# Patient Record
Sex: Female | Born: 1945 | Race: White | Hispanic: No | Marital: Married | Smoking: Never smoker
Health system: Southern US, Community
[De-identification: ages and names within clinical notes are randomized; demographics above are authoritative.]

## PROBLEM LIST (undated history)

## (undated) DIAGNOSIS — K219 Gastro-esophageal reflux disease without esophagitis: Secondary | ICD-10-CM

## (undated) DIAGNOSIS — C55 Malignant neoplasm of uterus, part unspecified: Secondary | ICD-10-CM

## (undated) DIAGNOSIS — M1990 Unspecified osteoarthritis, unspecified site: Secondary | ICD-10-CM

## (undated) DIAGNOSIS — T8859XA Other complications of anesthesia, initial encounter: Secondary | ICD-10-CM

## (undated) DIAGNOSIS — J40 Bronchitis, not specified as acute or chronic: Secondary | ICD-10-CM

## (undated) DIAGNOSIS — R062 Wheezing: Secondary | ICD-10-CM

## (undated) DIAGNOSIS — M8580 Other specified disorders of bone density and structure, unspecified site: Secondary | ICD-10-CM

## (undated) DIAGNOSIS — T4145XA Adverse effect of unspecified anesthetic, initial encounter: Secondary | ICD-10-CM

## (undated) DIAGNOSIS — R55 Syncope and collapse: Secondary | ICD-10-CM

## (undated) DIAGNOSIS — J302 Other seasonal allergic rhinitis: Secondary | ICD-10-CM

## (undated) DIAGNOSIS — H9190 Unspecified hearing loss, unspecified ear: Secondary | ICD-10-CM

## (undated) DIAGNOSIS — E039 Hypothyroidism, unspecified: Secondary | ICD-10-CM

## (undated) DIAGNOSIS — R112 Nausea with vomiting, unspecified: Secondary | ICD-10-CM

## (undated) DIAGNOSIS — Z9889 Other specified postprocedural states: Secondary | ICD-10-CM

## (undated) HISTORY — PX: TUBAL LIGATION: SHX77

## (undated) HISTORY — PX: ABDOMINAL HYSTERECTOMY: SHX81

---

## 2004-04-18 ENCOUNTER — Ambulatory Visit: Payer: Self-pay | Admitting: Unknown Physician Specialty

## 2004-12-05 ENCOUNTER — Emergency Department (HOSPITAL_COMMUNITY): Admission: EM | Admit: 2004-12-05 | Discharge: 2004-12-05 | Payer: Self-pay | Admitting: Emergency Medicine

## 2005-04-24 ENCOUNTER — Ambulatory Visit: Payer: Self-pay | Admitting: Unknown Physician Specialty

## 2005-10-15 ENCOUNTER — Emergency Department: Payer: Self-pay | Admitting: Emergency Medicine

## 2005-10-15 ENCOUNTER — Other Ambulatory Visit: Payer: Self-pay

## 2005-12-29 ENCOUNTER — Ambulatory Visit: Payer: Self-pay | Admitting: Gastroenterology

## 2006-04-27 ENCOUNTER — Ambulatory Visit: Payer: Self-pay | Admitting: Unknown Physician Specialty

## 2007-05-02 ENCOUNTER — Ambulatory Visit: Payer: Self-pay | Admitting: Unknown Physician Specialty

## 2007-07-25 ENCOUNTER — Ambulatory Visit: Payer: Self-pay | Admitting: Otolaryngology

## 2007-07-27 ENCOUNTER — Ambulatory Visit: Payer: Self-pay | Admitting: Otolaryngology

## 2007-07-27 IMAGING — CT CT CHEST-ABD W/ CM
1 of 2 series · 14 of 32 positions shown, 19 images · non-contrast
Comparison: none

REASON FOR EXAM: SOB Abn Chest Xray Eval for Subdithragmatic
COMMENTS:

[Series 2: soft tissue · axial · 0.74mm/px · z∈[+690,+1096]mm · 14 of 93 slices shown, 19 images]
[im 6/93  mediastinal]
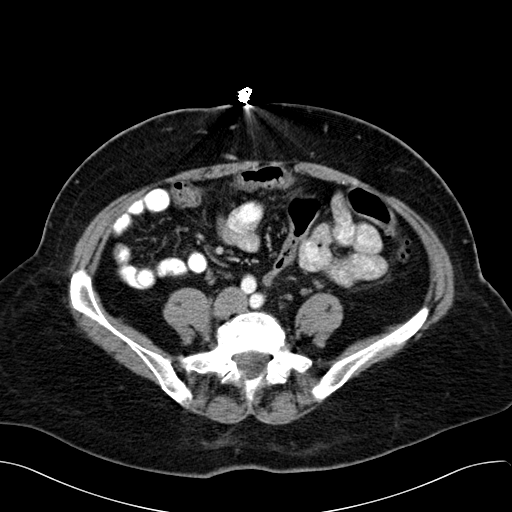
[im 6/93  bone]
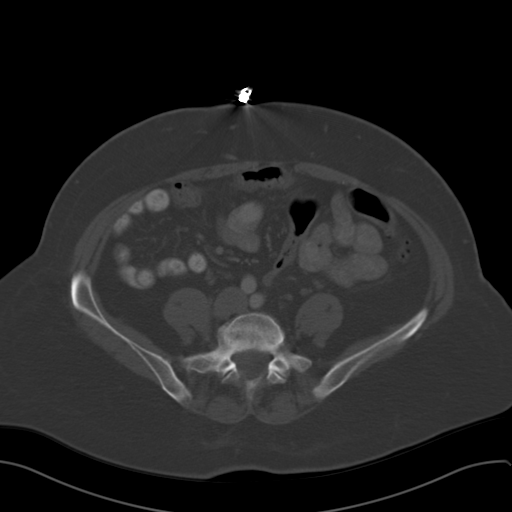
[im 18/93  mediastinal]
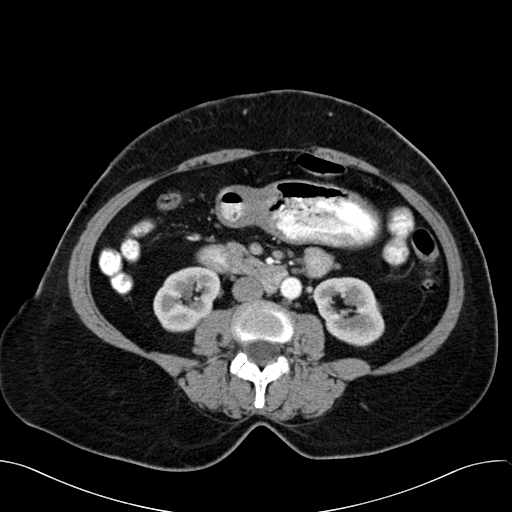
[im 24/93  mediastinal]
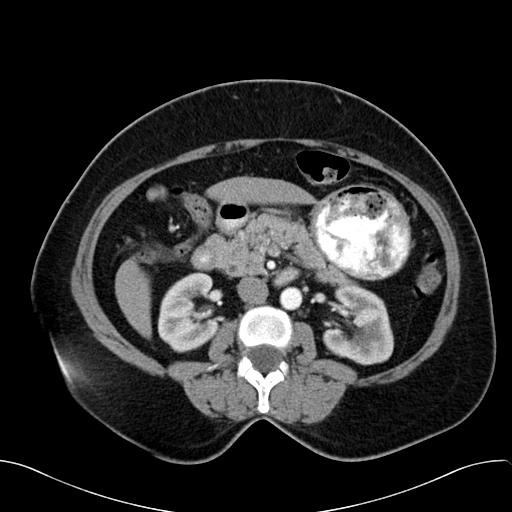
[im 29/93  mediastinal]
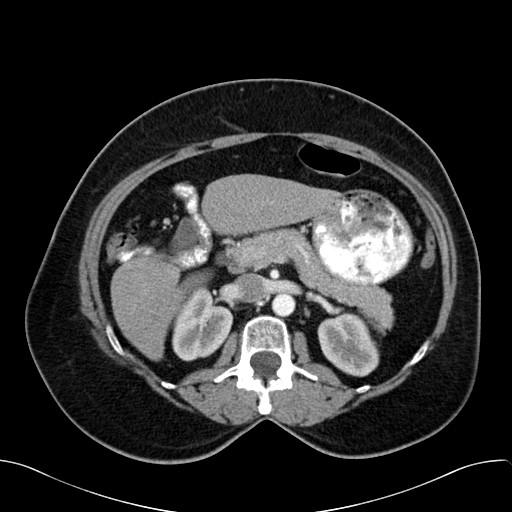
[im 35/93  mediastinal]
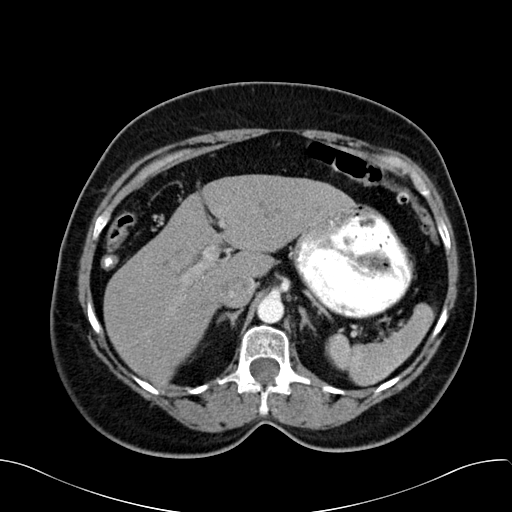
[im 41/93  mediastinal]
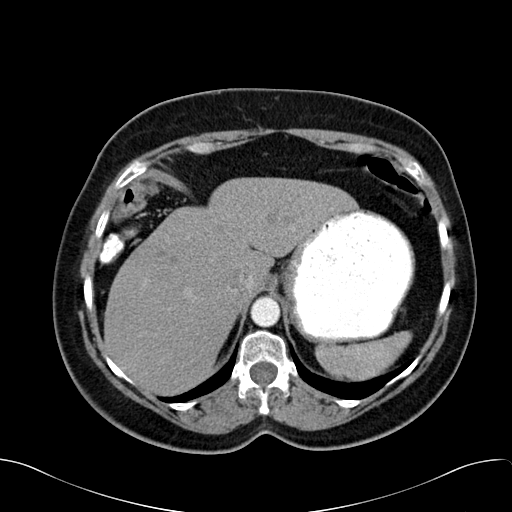
[im 47/93  mediastinal]
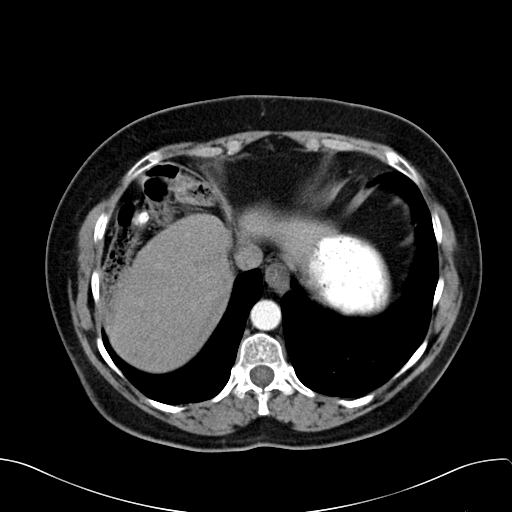
[im 52/93  mediastinal]
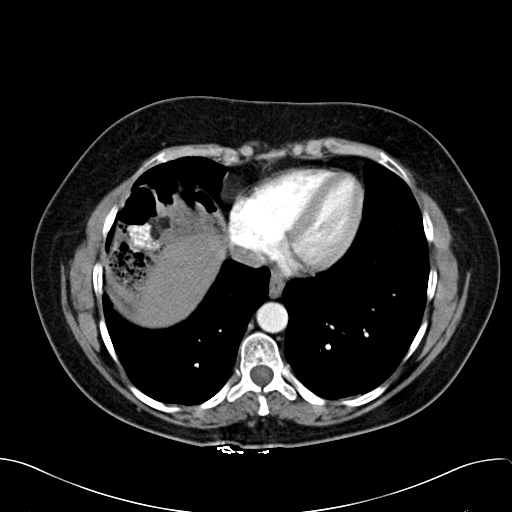
[im 58/93  mediastinal]
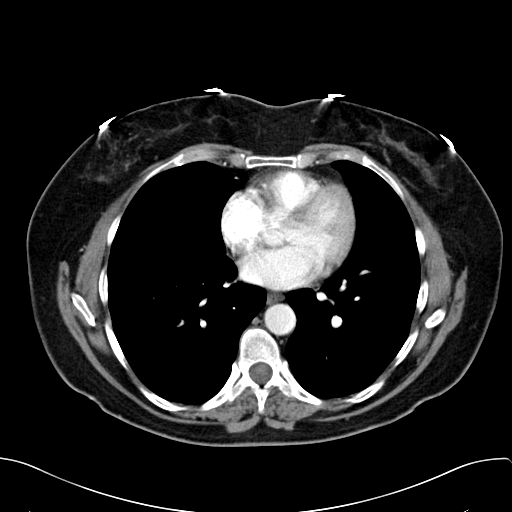
[im 58/93  bone]
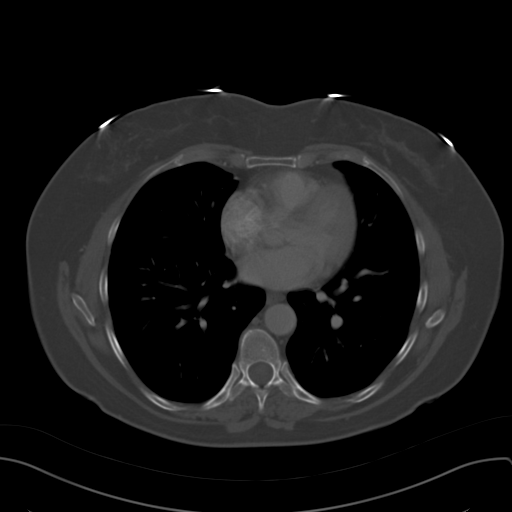
[im 64/93  mediastinal]
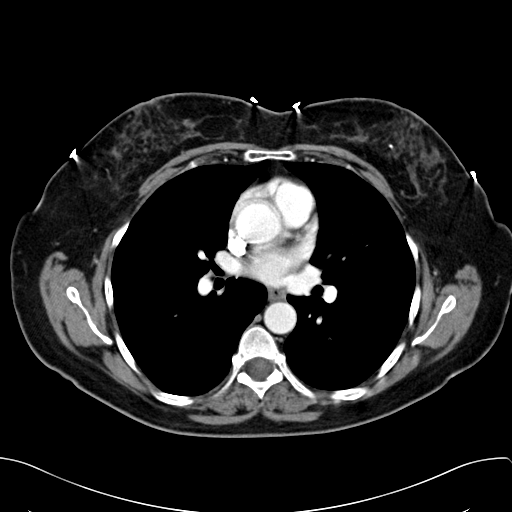
[im 70/93  mediastinal]
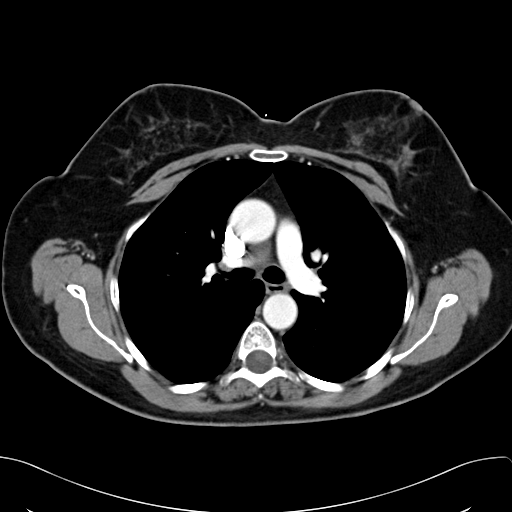
[im 70/93  lung]
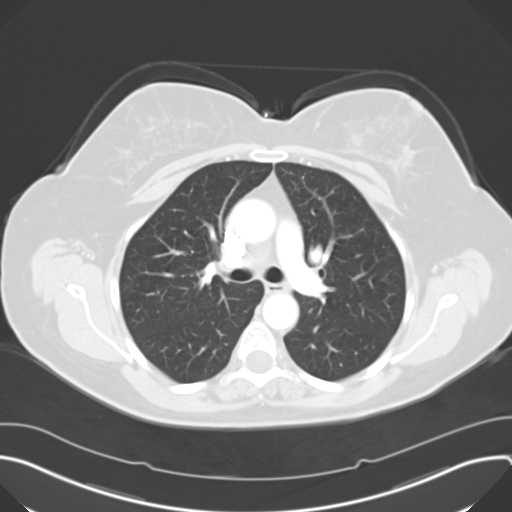
[im 75/93  mediastinal]
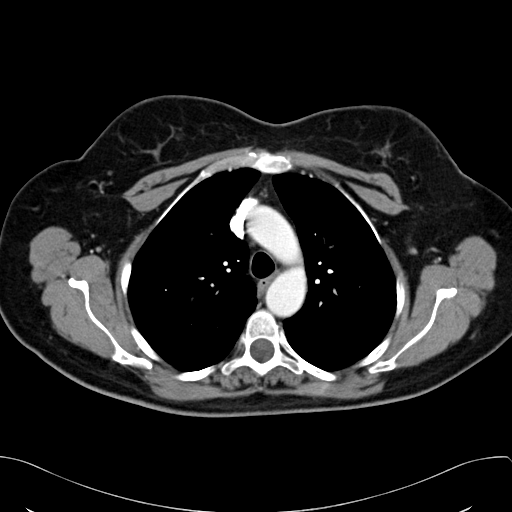
[im 75/93  lung]
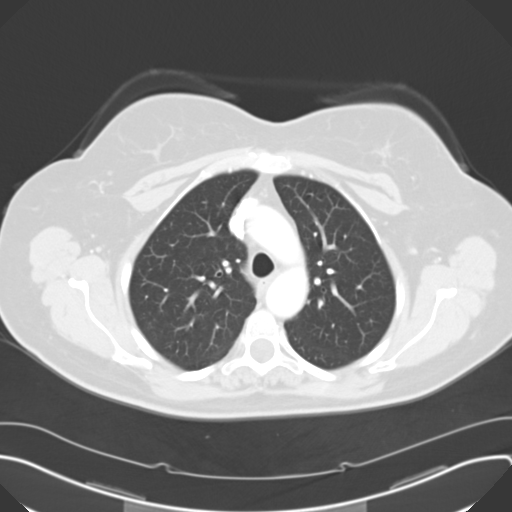
[im 81/93  lung]
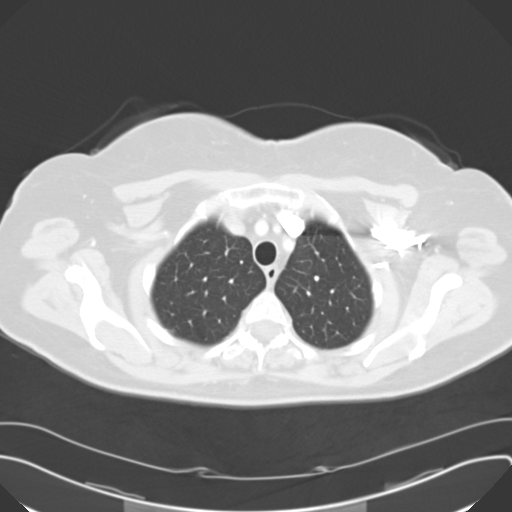
[im 87/93  mediastinal]
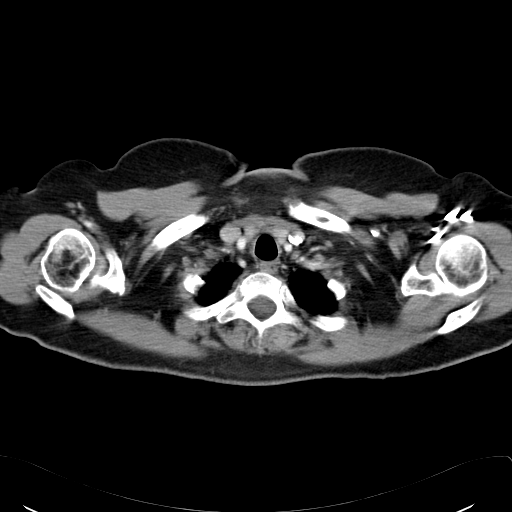
[im 87/93  lung]
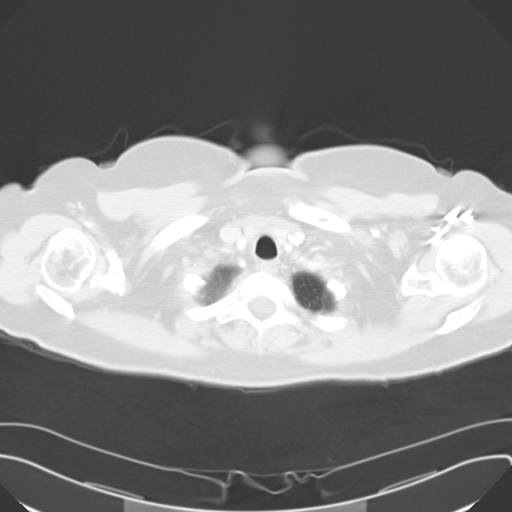

[14 of 32 positions shown; findings below may reference images not displayed]

PROCEDURE:     CT  - CT CHEST AND ABDOMEN W  - [DATE]  [DATE]

RESULT:     The patient received 85 cc of [8K] for this study as well
as oral contrast material.

CT scan of the chest: The thyroid lobes are normal in density and symmetric
in size. The caliber of the thoracic aorta is normal. The cardiac chambers
are normal in size. No pathologic sized mediastinal or hilar lymph nodes are
seen. There is no axillary nor retrosternal lymphadenopathy. There is no
pleural nor pericardial effusion. At lung window settings I see no
interstitial nor alveolar infiltrates. The thoracic vertebral bodies are
preserved in height.
CONCLUSION: I see no acute intrathoracic abnormality.

CT scan of the abdomen: There is interposition of loops of the colon
anterior to the surface of the liver on the right. This is responsible for
the chest x-ray appearance with air under the right hemidiaphragm. The liver
appears normal. There is a gallstone present. The partially distended
stomach is grossly normal. The stomach is normally positioned on the left.
The spleen, pancreas, adrenal glands, and kidneys are normal in appearance.
The caliber of the abdominal aorta is normal. The partially contrast filled
loops of small bowel are normal in appearance. The lumbar vertebral bodies
are preserved in height.
IMPRESSION: 1. I do not see acute abnormality within the thorax.
2. There is interposition of a loop of the hepatic flexure of the colon
between the anterior border of the liver and the anterior aspect of the
hemidiaphragm on the right. This the is known as the Chiladiti syndrome when
symptomatic. There is no evidence of an abscess nor bowel obstruction.
3. There is a gallstone present.

## 2008-05-02 ENCOUNTER — Ambulatory Visit: Payer: Self-pay | Admitting: Unknown Physician Specialty

## 2008-07-09 ENCOUNTER — Ambulatory Visit: Payer: Self-pay | Admitting: Internal Medicine

## 2009-05-03 ENCOUNTER — Ambulatory Visit: Payer: Self-pay | Admitting: Unknown Physician Specialty

## 2010-05-09 ENCOUNTER — Ambulatory Visit: Payer: Self-pay | Admitting: Unknown Physician Specialty

## 2011-05-21 ENCOUNTER — Ambulatory Visit: Payer: Self-pay | Admitting: Unknown Physician Specialty

## 2012-06-09 ENCOUNTER — Ambulatory Visit: Payer: Self-pay | Admitting: Internal Medicine

## 2013-06-12 ENCOUNTER — Ambulatory Visit: Payer: Self-pay | Admitting: Internal Medicine

## 2014-06-13 ENCOUNTER — Ambulatory Visit: Payer: Self-pay | Admitting: Internal Medicine

## 2014-11-15 ENCOUNTER — Encounter: Payer: Self-pay | Admitting: *Deleted

## 2014-11-20 ENCOUNTER — Encounter: Payer: Self-pay | Admitting: *Deleted

## 2014-11-20 ENCOUNTER — Ambulatory Visit: Payer: Federal, State, Local not specified - PPO | Admitting: Anesthesiology

## 2014-11-20 ENCOUNTER — Ambulatory Visit
Admission: EM | Admit: 2014-11-20 | Discharge: 2014-11-20 | Disposition: A | Discharge status: Home / Self Care | Payer: Federal, State, Local not specified - PPO | Source: Ambulatory Visit | Attending: Ophthalmology | Admitting: Ophthalmology

## 2014-11-20 ENCOUNTER — Encounter
Admission: EM | Disposition: A | Discharge status: Home / Self Care | Payer: Self-pay | Source: Ambulatory Visit | Attending: Ophthalmology

## 2014-11-20 DIAGNOSIS — Z90710 Acquired absence of both cervix and uterus: Secondary | ICD-10-CM | POA: Diagnosis not present

## 2014-11-20 DIAGNOSIS — Z8542 Personal history of malignant neoplasm of other parts of uterus: Secondary | ICD-10-CM | POA: Insufficient documentation

## 2014-11-20 DIAGNOSIS — Z79899 Other long term (current) drug therapy: Secondary | ICD-10-CM | POA: Insufficient documentation

## 2014-11-20 DIAGNOSIS — H9193 Unspecified hearing loss, bilateral: Secondary | ICD-10-CM | POA: Diagnosis not present

## 2014-11-20 DIAGNOSIS — H2511 Age-related nuclear cataract, right eye: Secondary | ICD-10-CM | POA: Diagnosis present

## 2014-11-20 DIAGNOSIS — R55 Syncope and collapse: Secondary | ICD-10-CM | POA: Diagnosis not present

## 2014-11-20 DIAGNOSIS — J40 Bronchitis, not specified as acute or chronic: Secondary | ICD-10-CM | POA: Diagnosis not present

## 2014-11-20 DIAGNOSIS — Z885 Allergy status to narcotic agent status: Secondary | ICD-10-CM | POA: Insufficient documentation

## 2014-11-20 DIAGNOSIS — E079 Disorder of thyroid, unspecified: Secondary | ICD-10-CM | POA: Insufficient documentation

## 2014-11-20 DIAGNOSIS — Z882 Allergy status to sulfonamides status: Secondary | ICD-10-CM | POA: Diagnosis not present

## 2014-11-20 DIAGNOSIS — M8580 Other specified disorders of bone density and structure, unspecified site: Secondary | ICD-10-CM | POA: Insufficient documentation

## 2014-11-20 DIAGNOSIS — R062 Wheezing: Secondary | ICD-10-CM | POA: Insufficient documentation

## 2014-11-20 HISTORY — DX: Hypothyroidism, unspecified: E03.9

## 2014-11-20 HISTORY — DX: Other seasonal allergic rhinitis: J30.2

## 2014-11-20 HISTORY — DX: Unspecified osteoarthritis, unspecified site: M19.90

## 2014-11-20 HISTORY — DX: Other complications of anesthesia, initial encounter: T88.59XA

## 2014-11-20 HISTORY — DX: Other specified disorders of bone density and structure, unspecified site: M85.80

## 2014-11-20 HISTORY — DX: Adverse effect of unspecified anesthetic, initial encounter: T41.45XA

## 2014-11-20 HISTORY — DX: Bronchitis, not specified as acute or chronic: J40

## 2014-11-20 HISTORY — DX: Malignant neoplasm of uterus, part unspecified: C55

## 2014-11-20 HISTORY — PX: CATARACT EXTRACTION W/PHACO: SHX586

## 2014-11-20 HISTORY — DX: Gastro-esophageal reflux disease without esophagitis: K21.9

## 2014-11-20 HISTORY — DX: Unspecified hearing loss, unspecified ear: H91.90

## 2014-11-20 HISTORY — DX: Syncope and collapse: R55

## 2014-11-20 HISTORY — DX: Wheezing: R06.2

## 2014-11-20 SURGERY — PHACOEMULSIFICATION, CATARACT, WITH IOL INSERTION
Anesthesia: Monitor Anesthesia Care | Site: Eye | Laterality: Right | Wound class: Clean

## 2014-11-20 MED ORDER — POVIDONE-IODINE 5 % OP SOLN
1.0000 "application " | OPHTHALMIC | Status: AC | PRN
  Administered 2014-11-20: 1 via OPHTHALMIC

## 2014-11-20 MED ORDER — POVIDONE-IODINE 5 % OP SOLN
OPHTHALMIC | Status: AC
  Administered 2014-11-20: 1 via OPHTHALMIC
  Filled 2014-11-20: qty 30

## 2014-11-20 MED ORDER — ARMC OPHTHALMIC DILATING GEL
1.0000 "application " | OPHTHALMIC | Status: AC
  Administered 2014-11-20: 1 via OPHTHALMIC

## 2014-11-20 MED ORDER — MOXIFLOXACIN HCL 0.5 % OP SOLN
OPHTHALMIC | Status: AC
  Filled 2014-11-20: qty 3

## 2014-11-20 MED ORDER — TETRACAINE HCL 0.5 % OP SOLN
1.0000 [drp] | Freq: Once | OPHTHALMIC | Status: AC
  Administered 2014-11-20: 1 [drp] via OPHTHALMIC

## 2014-11-20 MED ORDER — MIDAZOLAM HCL 2 MG/2ML IJ SOLN
INTRAMUSCULAR | Status: DC | PRN
  Administered 2014-11-20: 1 mg via INTRAVENOUS

## 2014-11-20 MED ORDER — EPINEPHRINE HCL 1 MG/ML IJ SOLN
INTRAMUSCULAR | Status: AC
  Filled 2014-11-20: qty 1

## 2014-11-20 MED ORDER — CEFUROXIME OPHTHALMIC INJECTION 1 MG/0.1 ML
INJECTION | OPHTHALMIC | Status: DC | PRN
Start: 2014-11-20 — End: 2014-11-20
  Administered 2014-11-20: 0.1 mL via INTRACAMERAL

## 2014-11-20 MED ORDER — NA CHONDROIT SULF-NA HYALURON 40-17 MG/ML IO SOLN
INTRAOCULAR | Status: AC
  Filled 2014-11-20: qty 1

## 2014-11-20 MED ORDER — SODIUM CHLORIDE 0.9 % IV SOLN
INTRAVENOUS | Status: DC
  Administered 2014-11-20: 06:00:00 via INTRAVENOUS

## 2014-11-20 MED ORDER — TETRACAINE HCL 0.5 % OP SOLN
OPHTHALMIC | Status: AC
  Administered 2014-11-20: 1 [drp] via OPHTHALMIC
  Filled 2014-11-20: qty 2

## 2014-11-20 MED ORDER — ARMC OPHTHALMIC DILATING GEL
OPHTHALMIC | Status: AC
  Administered 2014-11-20: 1 via OPHTHALMIC
  Filled 2014-11-20: qty 0.25

## 2014-11-20 MED ORDER — CARBACHOL 0.01 % IO SOLN
INTRAOCULAR | Status: DC | PRN
  Administered 2014-11-20: 0.5 mL via INTRAOCULAR

## 2014-11-20 MED ORDER — EPINEPHRINE HCL 1 MG/ML IJ SOLN
INTRAMUSCULAR | Status: DC | PRN
  Administered 2014-11-20: 200 mL

## 2014-11-20 MED ORDER — MOXIFLOXACIN HCL 0.5 % OP SOLN
OPHTHALMIC | Status: DC | PRN
  Administered 2014-11-20: 1 [drp] via OPHTHALMIC

## 2014-11-20 MED ORDER — CEFUROXIME OPHTHALMIC INJECTION 1 MG/0.1 ML
INJECTION | OPHTHALMIC | Status: AC
  Filled 2014-11-20: qty 0.1

## 2014-11-20 MED ORDER — LACTATED RINGERS IV SOLN: INTRAVENOUS | Status: DC

## 2014-11-20 SURGICAL SUPPLY — 19 items
CANNULA ANT/CHMB 27GA (MISCELLANEOUS) ×3 IMPLANT
GLOVE BIO SURGEON STRL SZ8 (GLOVE) ×3 IMPLANT
GLOVE BIOGEL M 6.5 STRL (GLOVE) ×3 IMPLANT
GLOVE SURG LX 8.0 MICRO (GLOVE) ×2
GLOVE SURG LX STRL 8.0 MICRO (GLOVE) ×1 IMPLANT
GOWN STRL REUS W/ TWL LRG LVL3 (GOWN DISPOSABLE) ×2 IMPLANT
GOWN STRL REUS W/TWL LRG LVL3 (GOWN DISPOSABLE) ×4
LENS IOL TORIC 6.0 (Intraocular Lens) ×3 IMPLANT
PACK CATARACT (MISCELLANEOUS) ×3 IMPLANT
PACK CATARACT BRASINGTON LX (MISCELLANEOUS) ×3 IMPLANT
PACK EYE AFTER SURG (MISCELLANEOUS) ×3 IMPLANT
SOL BSS BAG (MISCELLANEOUS) ×3
SOL PREP PVP 2OZ (MISCELLANEOUS) ×3
SOLUTION BSS BAG (MISCELLANEOUS) ×1 IMPLANT
SOLUTION PREP PVP 2OZ (MISCELLANEOUS) ×1 IMPLANT
SYR 5ML LL (SYRINGE) ×3 IMPLANT
SYR TB 1ML 27GX1/2 LL (SYRINGE) ×3 IMPLANT
WATER STERILE IRR 1000ML POUR (IV SOLUTION) ×3 IMPLANT
WIPE NON LINTING 3.25X3.25 (MISCELLANEOUS) ×3 IMPLANT

## 2014-11-20 NOTE — Anesthesia Postprocedure Evaluation (Signed)
  Anesthesia Post-op Note  Patient: Brandy Patel  Procedure(s) Performed: Procedure(s) with comments: CATARACT EXTRACTION PHACO AND INTRAOCULAR LENS PLACEMENT (IOC) (Right) - [place] 00:48 AP% 20.0 CDE 9.73 fluid pack lot #[id number]  Anesthesia type:MAC  Patient location: PACU  Post pain: Pain level controlled  Post assessment: Post-op Vital signs reviewed, Patient's Cardiovascular Status Stable, Respiratory Function Stable, Patent Airway and No signs of Nausea or vomiting  Post vital signs: Reviewed and stable  Last Vitals:  Filed Vitals:   11/20/14 0819  BP: 105/86  Pulse:   Temp: 35.6 C  Resp:     Level of consciousness: awake, alert  and patient cooperative  Complications: No apparent anesthesia complications

## 2014-11-20 NOTE — Anesthesia Preprocedure Evaluation (Addendum)
Anesthesia Evaluation  Patient identified by MRN, date of birth, ID band Patient awake    Reviewed: Allergy & Precautions, H&P , NPO status , Patient's Chart, lab work & pertinent test results  History of Anesthesia Complications (+) PONV and history of anesthetic complications  Airway Mallampati: II   Neck ROM: full    Dental   Pulmonary asthma ,  breath sounds clear to auscultation        Cardiovascular negative cardio ROS  Rhythm:regular Rate:Normal     Neuro/Psych negative neurological ROS  negative psych ROS   GI/Hepatic negative GI ROS, Neg liver ROS, GERD-  ,  Endo/Other  Hypothyroidism   Renal/GU negative Renal ROS     Musculoskeletal   Abdominal   Peds  Hematology   Anesthesia Other Findings On medicine for UTI, no symptoms now  Reproductive/Obstetrics negative OB ROS                             Anesthesia Physical Anesthesia Plan  ASA: III  Anesthesia Plan: MAC   Post-op Pain Management:    Induction:   Airway Management Planned:   Additional Equipment:   Intra-op Plan:   Post-operative Plan:   Informed Consent: I have reviewed the patients History and Physical, chart, labs and discussed the procedure including the risks, benefits and alternatives for the proposed anesthesia with the patient or authorized representative who has indicated his/her understanding and acceptance.   Dental Advisory Given  Plan Discussed with: Anesthesiologist, CRNA and Surgeon  Anesthesia Plan Comments:        Anesthesia Quick Evaluation

## 2014-11-20 NOTE — Transfer of Care (Signed)
Immediate Anesthesia Transfer of Care Note  Patient: Brandy Patel  Procedure(s) Performed: Procedure(s) with comments: CATARACT EXTRACTION PHACO AND INTRAOCULAR LENS PLACEMENT (IOC) (Right) - [place] 00:48 AP% 20.0 CDE 9.73 fluid pack lot #[id number]  Patient Location: PACU  Anesthesia Type:MAC  Level of Consciousness: awake, alert , oriented and patient cooperative  Airway & Oxygen Therapy: Patient Spontanous Breathing  Post-op Assessment: Report given to RN and Post -op Vital signs reviewed and stable  Post vital signs: Reviewed and stable  Last Vitals:  Filed Vitals:   11/20/14 0819  BP: 105/86  Pulse:   Temp: 35.6 C  Resp:     Complications: No apparent anesthesia complications

## 2014-11-20 NOTE — Anesthesia Postprocedure Evaluation (Signed)
  Anesthesia Post-op Note  Patient: Brandy Patel  Procedure(s) Performed: Procedure(s) with comments: CATARACT EXTRACTION PHACO AND INTRAOCULAR LENS PLACEMENT (IOC) (Right) - [place] 00:48 AP% 20.0 CDE 9.73 fluid pack lot #[id number]  Anesthesia type:MAC  Patient location: PACU  Post pain: Pain level controlled  Post assessment: Post-op Vital signs reviewed, Patient's Cardiovascular Status Stable, Respiratory Function Stable, Patent Airway and No signs of Nausea or vomiting  Post vital signs: Reviewed and stable  Last Vitals:  Filed Vitals:   11/20/14 0819  BP: 105/86  Pulse:   Temp: 35.6 C  Resp:     Level of consciousness: awake, alert  and patient cooperative  Complications: No apparent anesthesia complications

## 2014-11-20 NOTE — H&P (Signed)
  All labs reviewed. Abnormal studies sent to patients PCP when indicated.  Previous H&P reviewed, patient examined, there are NO CHANGES.  Brandy Patel LOUIS7/5/20167:29 AM

## 2014-11-20 NOTE — Op Note (Signed)
PREOPERATIVE DIAGNOSIS:  Nuclear sclerotic cataract of the right eye.   POSTOPERATIVE DIAGNOSIS:  Nuclear sclerotic cataract of the right eye.   OPERATIVE PROCEDURE:  Procedure(s): CATARACT EXTRACTION PHACO AND INTRAOCULAR LENS PLACEMENT (IOC)   SURGEON:  Birder Robson, MD.   ANESTHESIA: 1.      Managed anesthesia care. 2.      Topical tetracaine drops followed by 2% Xylocaine jelly applied in the preoperative holding area.  Anesthesiologist: Andria Frames, MD CRNA: Bernardo Heater, CRNA   COMPLICATIONS:  None.   TECHNIQUE:   Stop and chop    DESCRIPTION OF PROCEDURE:  The patient was examined and consented in the preoperative holding area where the aforementioned topical anesthesia was applied to the right eye.  The patient was brought back to the Operating Room where he was sat upright on the gurney and given a target to fixate upon while the eye was marked at the 3:00 and 9:00 position.  The patient was then reclined on the operating table.  The eye was prepped and draped in the usual sterile ophthalmic fashion and a lid speculum was placed. A paracentesis was created with the side port blade and the anterior chamber was filled with viscoelastic. A near clear corneal incision was performed with the steel keratome. A continuous curvilinear capsulorrhexis was performed with a cystotome followed by the capsulorrhexis forceps. Hydrodissection and hydrodelineation were carried out with BSS on a blunt cannula. The lens was removed in a stop and chop technique and the remaining cortical material was removed with the irrigation-aspiration handpiece. The eye was inflated with viscoelastic and the ZCT lens  was placed in the eye and rotated to within a few degrees of the predetermined orientation.  The remaining viscoelastic was removed from the eye.  The Sinskey hook was used to rotate the toric lens into its final resting place at 093 degrees.  0.1 mL of cefuroxime concentration 10 mg/mL was  placed in the anterior chamber. The eye was inflated to a physiologic pressure and found to be watertight.  The eye was dressed with Vigamox. The patient was given protective glasses to wear throughout the day and a shield with which to sleep tonight. The patient was also given drops with which to begin a drop regimen today and will follow-up with me in one day.  Implant Name Type Inv. Item Serial No. Manufacturer Lot No. LRB No. Used  [id number]           Right 1   Procedure(s) with comments: CATARACT EXTRACTION PHACO AND INTRAOCULAR LENS PLACEMENT (IOC) (Right) - [place] 00:48 AP% 20.0 CDE 9.73 fluid pack lot #[id number]  Electronically signed: [place] 11/20/2014 8:16 AM

## 2014-11-20 NOTE — Discharge Instructions (Signed)

## 2014-11-20 NOTE — Anesthesia Postprocedure Evaluation (Incomplete)
  Anesthesia Post-op Note  Patient: Brandy Patel  Procedure(s) Performed: Procedure(s) with comments: CATARACT EXTRACTION PHACO AND INTRAOCULAR LENS PLACEMENT (IOC) (Right) - [place] 00:48 AP% 20.0 CDE 9.73 fluid pack lot #[id number]  Anesthesia type:MAC  Patient location: PACU  Post pain: Pain level controlled  Post assessment: Post-op Vital signs reviewed, Patient's Cardiovascular Status Stable, Respiratory Function Stable, Patent Airway and No signs of Nausea or vomiting  Post vital signs: Reviewed and stable  Last Vitals:  Filed Vitals:   11/20/14 0819  BP: 105/86  Pulse:   Temp: 35.6 C  Resp:     Level of consciousness: awake, alert  and patient cooperative  Complications: No apparent anesthesia complications

## 2014-11-28 ENCOUNTER — Inpatient Hospital Stay: Admission: RE | Admit: 2014-11-28 | Payer: Federal, State, Local not specified - PPO | Source: Ambulatory Visit

## 2014-12-03 ENCOUNTER — Encounter: Payer: Self-pay | Admitting: *Deleted

## 2014-12-03 DIAGNOSIS — Z90710 Acquired absence of both cervix and uterus: Secondary | ICD-10-CM | POA: Diagnosis not present

## 2014-12-03 DIAGNOSIS — H9193 Unspecified hearing loss, bilateral: Secondary | ICD-10-CM | POA: Diagnosis not present

## 2014-12-03 DIAGNOSIS — K219 Gastro-esophageal reflux disease without esophagitis: Secondary | ICD-10-CM | POA: Diagnosis not present

## 2014-12-03 DIAGNOSIS — M8580 Other specified disorders of bone density and structure, unspecified site: Secondary | ICD-10-CM | POA: Diagnosis not present

## 2014-12-03 DIAGNOSIS — Z8542 Personal history of malignant neoplasm of other parts of uterus: Secondary | ICD-10-CM | POA: Diagnosis not present

## 2014-12-03 DIAGNOSIS — R55 Syncope and collapse: Secondary | ICD-10-CM | POA: Diagnosis not present

## 2014-12-03 DIAGNOSIS — M1990 Unspecified osteoarthritis, unspecified site: Secondary | ICD-10-CM | POA: Diagnosis not present

## 2014-12-03 DIAGNOSIS — E079 Disorder of thyroid, unspecified: Secondary | ICD-10-CM | POA: Diagnosis not present

## 2014-12-03 DIAGNOSIS — J40 Bronchitis, not specified as acute or chronic: Secondary | ICD-10-CM | POA: Diagnosis not present

## 2014-12-03 DIAGNOSIS — Z885 Allergy status to narcotic agent status: Secondary | ICD-10-CM | POA: Diagnosis not present

## 2014-12-03 DIAGNOSIS — Z9841 Cataract extraction status, right eye: Secondary | ICD-10-CM | POA: Diagnosis not present

## 2014-12-03 DIAGNOSIS — Z79899 Other long term (current) drug therapy: Secondary | ICD-10-CM | POA: Diagnosis not present

## 2014-12-03 DIAGNOSIS — H2512 Age-related nuclear cataract, left eye: Secondary | ICD-10-CM | POA: Diagnosis not present

## 2014-12-03 DIAGNOSIS — R062 Wheezing: Secondary | ICD-10-CM | POA: Diagnosis not present

## 2014-12-03 DIAGNOSIS — Z882 Allergy status to sulfonamides status: Secondary | ICD-10-CM | POA: Diagnosis not present

## 2014-12-04 ENCOUNTER — Ambulatory Visit: Payer: Federal, State, Local not specified - PPO | Admitting: Anesthesiology

## 2014-12-04 ENCOUNTER — Ambulatory Visit
Admission: RE | Admit: 2014-12-04 | Discharge: 2014-12-04 | Disposition: A | Discharge status: Home / Self Care | Payer: Federal, State, Local not specified - PPO | Source: Ambulatory Visit | Attending: Ophthalmology | Admitting: Ophthalmology

## 2014-12-04 ENCOUNTER — Encounter
Admission: RE | Disposition: A | Discharge status: Home / Self Care | Payer: Self-pay | Source: Ambulatory Visit | Attending: Ophthalmology

## 2014-12-04 ENCOUNTER — Encounter: Payer: Self-pay | Admitting: Anesthesiology

## 2014-12-04 DIAGNOSIS — K219 Gastro-esophageal reflux disease without esophagitis: Secondary | ICD-10-CM | POA: Insufficient documentation

## 2014-12-04 DIAGNOSIS — E079 Disorder of thyroid, unspecified: Secondary | ICD-10-CM | POA: Insufficient documentation

## 2014-12-04 DIAGNOSIS — H9193 Unspecified hearing loss, bilateral: Secondary | ICD-10-CM | POA: Insufficient documentation

## 2014-12-04 DIAGNOSIS — Z79899 Other long term (current) drug therapy: Secondary | ICD-10-CM | POA: Insufficient documentation

## 2014-12-04 DIAGNOSIS — R55 Syncope and collapse: Secondary | ICD-10-CM | POA: Insufficient documentation

## 2014-12-04 DIAGNOSIS — M1990 Unspecified osteoarthritis, unspecified site: Secondary | ICD-10-CM | POA: Insufficient documentation

## 2014-12-04 DIAGNOSIS — H2512 Age-related nuclear cataract, left eye: Secondary | ICD-10-CM | POA: Insufficient documentation

## 2014-12-04 DIAGNOSIS — Z8542 Personal history of malignant neoplasm of other parts of uterus: Secondary | ICD-10-CM | POA: Insufficient documentation

## 2014-12-04 DIAGNOSIS — Z9841 Cataract extraction status, right eye: Secondary | ICD-10-CM | POA: Insufficient documentation

## 2014-12-04 DIAGNOSIS — Z885 Allergy status to narcotic agent status: Secondary | ICD-10-CM | POA: Insufficient documentation

## 2014-12-04 DIAGNOSIS — M8580 Other specified disorders of bone density and structure, unspecified site: Secondary | ICD-10-CM | POA: Insufficient documentation

## 2014-12-04 DIAGNOSIS — J40 Bronchitis, not specified as acute or chronic: Secondary | ICD-10-CM | POA: Insufficient documentation

## 2014-12-04 DIAGNOSIS — Z90710 Acquired absence of both cervix and uterus: Secondary | ICD-10-CM | POA: Insufficient documentation

## 2014-12-04 DIAGNOSIS — Z882 Allergy status to sulfonamides status: Secondary | ICD-10-CM | POA: Insufficient documentation

## 2014-12-04 DIAGNOSIS — R062 Wheezing: Secondary | ICD-10-CM | POA: Insufficient documentation

## 2014-12-04 HISTORY — DX: Other specified postprocedural states: Z98.890

## 2014-12-04 HISTORY — PX: CATARACT EXTRACTION W/PHACO: SHX586

## 2014-12-04 HISTORY — DX: Nausea with vomiting, unspecified: R11.2

## 2014-12-04 SURGERY — PHACOEMULSIFICATION, CATARACT, WITH IOL INSERTION
Anesthesia: Monitor Anesthesia Care | Site: Eye | Laterality: Left | Wound class: Clean

## 2014-12-04 MED ORDER — TETRACAINE HCL 0.5 % OP SOLN
1.0000 [drp] | OPHTHALMIC | Status: AC | PRN
  Administered 2014-12-04: 1 [drp] via OPHTHALMIC

## 2014-12-04 MED ORDER — MOXIFLOXACIN HCL 0.5 % OP SOLN
OPHTHALMIC | Status: AC
  Filled 2014-12-04: qty 3

## 2014-12-04 MED ORDER — EPINEPHRINE HCL 1 MG/ML IJ SOLN
INTRAOCULAR | Status: DC | PRN
  Administered 2014-12-04: 200 mL

## 2014-12-04 MED ORDER — POVIDONE-IODINE 5 % OP SOLN
1.0000 "application " | OPHTHALMIC | Status: AC | PRN
  Administered 2014-12-04: 1 via OPHTHALMIC

## 2014-12-04 MED ORDER — NA CHONDROIT SULF-NA HYALURON 40-17 MG/ML IO SOLN
INTRAOCULAR | Status: AC
  Filled 2014-12-04: qty 1

## 2014-12-04 MED ORDER — MOXIFLOXACIN HCL 0.5 % OP SOLN
OPHTHALMIC | Status: DC | PRN
  Administered 2014-12-04: 2 [drp] via OPHTHALMIC

## 2014-12-04 MED ORDER — ARMC OPHTHALMIC DILATING GEL
1.0000 "application " | OPHTHALMIC | Status: DC | PRN
  Administered 2014-12-04: 1 via OPHTHALMIC

## 2014-12-04 MED ORDER — SODIUM CHLORIDE 0.9 % IV SOLN
INTRAVENOUS | Status: DC
  Administered 2014-12-04: 09:00:00 via INTRAVENOUS

## 2014-12-04 MED ORDER — MIDAZOLAM HCL 2 MG/2ML IJ SOLN
INTRAMUSCULAR | Status: DC | PRN
  Administered 2014-12-04: 1 mg via INTRAVENOUS

## 2014-12-04 MED ORDER — POVIDONE-IODINE 5 % OP SOLN
OPHTHALMIC | Status: AC
  Administered 2014-12-04: 1 via OPHTHALMIC
  Filled 2014-12-04: qty 30

## 2014-12-04 MED ORDER — CEFUROXIME OPHTHALMIC INJECTION 1 MG/0.1 ML
INJECTION | OPHTHALMIC | Status: DC | PRN
  Administered 2014-12-04: 0.1 mL via INTRACAMERAL

## 2014-12-04 MED ORDER — ARMC OPHTHALMIC DILATING GEL
OPHTHALMIC | Status: AC
  Administered 2014-12-04: 1 via OPHTHALMIC
  Filled 2014-12-04: qty 0.25

## 2014-12-04 MED ORDER — CEFUROXIME OPHTHALMIC INJECTION 1 MG/0.1 ML
INJECTION | OPHTHALMIC | Status: AC
  Filled 2014-12-04: qty 0.1

## 2014-12-04 MED ORDER — TETRACAINE HCL 0.5 % OP SOLN
OPHTHALMIC | Status: AC
  Administered 2014-12-04: 1 [drp] via OPHTHALMIC
  Filled 2014-12-04: qty 2

## 2014-12-04 MED ORDER — EPINEPHRINE HCL 1 MG/ML IJ SOLN
INTRAMUSCULAR | Status: AC
  Filled 2014-12-04: qty 1

## 2014-12-04 MED ORDER — CARBACHOL 0.01 % IO SOLN
INTRAOCULAR | Status: DC | PRN
  Administered 2014-12-04: .1 mL via INTRAOCULAR

## 2014-12-04 MED ORDER — FENTANYL CITRATE (PF) 100 MCG/2ML IJ SOLN
INTRAMUSCULAR | Status: DC | PRN
  Administered 2014-12-04: 50 ug via INTRAVENOUS

## 2014-12-04 SURGICAL SUPPLY — 20 items
CANNULA ANT/CHMB 27GA (MISCELLANEOUS) ×3 IMPLANT
GLOVE BIO SURGEON STRL SZ8 (GLOVE) ×3 IMPLANT
GLOVE BIOGEL M 6.5 STRL (GLOVE) ×3 IMPLANT
GLOVE SURG LX 8.0 MICRO (GLOVE) ×2
GLOVE SURG LX STRL 8.0 MICRO (GLOVE) ×1 IMPLANT
GOWN STRL REUS W/ TWL LRG LVL3 (GOWN DISPOSABLE) ×2 IMPLANT
GOWN STRL REUS W/TWL LRG LVL3 (GOWN DISPOSABLE) ×4
LENS IOL TECNIS 5.0 (Intraocular Lens) ×3 IMPLANT
PACK CATARACT (MISCELLANEOUS) ×3 IMPLANT
PACK CATARACT BRASINGTON LX (MISCELLANEOUS) ×3 IMPLANT
PACK EYE AFTER SURG (MISCELLANEOUS) ×3 IMPLANT
SOL BSS BAG (MISCELLANEOUS) ×3
SOL PREP PVP 2OZ (MISCELLANEOUS) ×3
SOLUTION BSS BAG (MISCELLANEOUS) ×1 IMPLANT
SOLUTION PREP PVP 2OZ (MISCELLANEOUS) ×1 IMPLANT
SYR 5ML LL (SYRINGE) ×3 IMPLANT
SYR TB 1ML 27GX1/2 LL (SYRINGE) ×3 IMPLANT
WATER STERILE IRR 1000ML POUR (IV SOLUTION) ×3 IMPLANT
WIPE NON LINTING 3.25X3.25 (MISCELLANEOUS) ×3 IMPLANT
zcboo ×3 IMPLANT

## 2014-12-04 NOTE — Anesthesia Postprocedure Evaluation (Signed)
  Anesthesia Post-op Note  Patient: Brandy Patel  Procedure(s) Performed: Procedure(s) with comments: CATARACT EXTRACTION PHACO AND INTRAOCULAR LENS PLACEMENT (IOC) (Left) - [place]: 00:45.0 AP%; 23.5 CE: 10.59 Fluid lot # [id number] H  Anesthesia type:MAC  Patient location: PACU  Post pain: Pain level controlled  Post assessment: Post-op Vital signs reviewed, Patient's Cardiovascular Status Stable, Respiratory Function Stable, Patent Airway and No signs of Nausea or vomiting  Post vital signs: Reviewed and stable  Last Vitals:  Filed Vitals:   12/04/14 1027  BP: 133/74  Pulse: 68  Temp: 36.2 C  Resp: 16    Level of consciousness: awake, alert  and patient cooperative  Complications: No apparent anesthesia complications

## 2014-12-04 NOTE — Op Note (Signed)
PREOPERATIVE DIAGNOSIS:  Nuclear sclerotic cataract of the left eye.   POSTOPERATIVE DIAGNOSIS:  Left nuclear sclerotic cataract   OPERATIVE PROCEDURE:  Procedure(s): CATARACT EXTRACTION PHACO AND INTRAOCULAR LENS PLACEMENT (IOC)   SURGEON:  Birder Robson, MD.   ANESTHESIA:   Anesthesiologist: Gunnar Bulla, MD CRNA: Bernardo Heater, CRNA  1.      Managed anesthesia care. 2.      Topical tetracaine drops followed by 2% Xylocaine jelly applied in the preoperative holding area.   COMPLICATIONS:  None.   TECHNIQUE:   Stop and chop   DESCRIPTION OF PROCEDURE:  The patient was examined and consented in the preoperative holding area where the aforementioned topical anesthesia was applied to the left eye and then brought back to the Operating Room where the left eye was prepped and draped in the usual sterile ophthalmic fashion and a lid speculum was placed. A paracentesis was created with the side port blade and the anterior chamber was filled with viscoelastic. A near clear corneal incision was performed with the steel keratome. A continuous curvilinear capsulorrhexis was performed with a cystotome followed by the capsulorrhexis forceps. Hydrodissection and hydrodelineation were carried out with BSS on a blunt cannula. The lens was removed in a stop and chop  technique and the remaining cortical material was removed with the irrigation-aspiration handpiece. The capsular bag was inflated with viscoelastic and the Technis [id number] lens was placed in the capsular bag without complication. The remaining viscoelastic was removed from the eye with the irrigation-aspiration handpiece. The wounds were hydrated. The anterior chamber was flushed with Miostat and the eye was inflated to physiologic pressure. 0.1 mL of cefuroxime concentration 10 mg/mL was placed in the anterior chamber. The wounds were found to be water tight. The eye was dressed with Vigamox. The patient was given protective glasses to wear  throughout the day and a shield with which to sleep tonight. The patient was also given drops with which to begin a drop regimen today and will follow-up with me in one day.  Implant Name Type Inv. Item Serial No. Manufacturer Lot No. LRB No. Used  zcboo     [id number]     Left 1   Procedure(s) with comments: CATARACT EXTRACTION PHACO AND INTRAOCULAR LENS PLACEMENT (IOC) (Left) - [place]: 00:45.0 AP%; 23.5 CE: 10.59 Fluid lot # [id number] H  Electronically signed: [place] 12/04/2014 10:24 AM

## 2014-12-04 NOTE — H&P (Signed)
  All labs reviewed. Abnormal studies sent to patients PCP when indicated.  Previous H&P reviewed, patient examined, there are NO CHANGES.  Brandy Patel LOUIS7/19/20169:55 AM

## 2014-12-04 NOTE — Discharge Instructions (Signed)
AMBULATORY SURGERY  °DISCHARGE INSTRUCTIONS ° ° °1) The drugs that you were given will stay in your system until tomorrow so for the next 24 hours you should not: ° °A) Drive an automobile °B) Make any legal decisions °C) Drink any alcoholic beverage ° ° °2) You may resume regular meals tomorrow.  Today it is better to start with liquids and gradually work up to solid foods. ° °You may eat anything you prefer, but it is better to start with liquids, then soup and crackers, and gradually work up to solid foods. ° ° °3) Please notify your doctor immediately if you have any unusual bleeding, trouble breathing, redness and pain at the surgery site, drainage, fever, or pain not relieved by medication. ° ° ° °4) Additional Instructions: ° ° ° °Cataract Surgery °Care After °Refer to this sheet in the next few weeks. These instructions provide you with information on caring for yourself after your procedure. Your caregiver may also give you more specific instructions. Your treatment has been planned according to current medical practices, but problems sometimes occur. Call your caregiver if you have any problems or questions after your procedure.  °HOME CARE INSTRUCTIONS  °· Avoid strenuous activities as directed by your caregiver. °· Ask your caregiver when you can resume driving. °· Use eyedrops or other medicines to help healing and control pressure inside your eye as directed by your caregiver. °· Only take over-the-counter or prescription medicines for pain, discomfort, or fever as directed by your caregiver. °· Do not to touch or rub your eyes. °· You may be instructed to use a protective shield during the first few days and nights after surgery. If not, wear sunglasses to protect your eyes. This is to protect the eye from pressure or from being accidentally bumped. °· Keep the area around your eye clean and dry. Avoid swimming or allowing water to hit you directly in the face while showering. Keep soap and shampoo  out of your eyes. °· Do not bend or lift heavy objects. Bending increases pressure in the eye. You can walk, climb stairs, and do light household chores. °· Do not put a contact lens into the eye that had surgery until your caregiver says it is okay to do so. °· Ask your doctor when you can return to work. This will depend on the kind of work that you do. If you work in a dusty environment, you may be advised to wear protective eyewear for a period of time. °· Ask your caregiver when it will be safe to engage in sexual activity. °· Continue with your regular eye exams as directed by your caregiver. °What to expect: °· It is normal to feel itching and mild discomfort for a few days after cataract surgery. Some fluid discharge is also common, and your eye may be sensitive to light and touch. °· After 1 to 2 days, even moderate discomfort should disappear. In most cases, healing will take about 6 weeks. °· If you received an intraocular lens (IOL), you may notice that colors are very bright or have a blue tinge. Also, if you have been in bright sunlight, everything may appear reddish for a few hours. If you see these color tinges, it is because your lens is clear and no longer cloudy. Within a few months after receiving an IOL, these extra colors should go away. When you have healed, you will probably need new glasses. °SEEK MEDICAL CARE IF:  °· You have increased bruising around your   eye. °· You have discomfort not helped by medicine. °SEEK IMMEDIATE MEDICAL CARE IF:  °· You have a  fever. °· You have a worsening or sudden vision loss. °· You have redness, swelling, or increasing pain in the eye. °· You have a thick discharge from the eye that had surgery. °MAKE SURE YOU: °· Understand these instructions. °· Will watch your condition. °· Will get help right away if you are not doing well or get worse. °Document Released: 11/21/2004 Document Revised: 07/27/2011 Document Reviewed: 12/26/2010 °[employer]® Patient  Information ©2015 [employer]. This information is not intended to replace advice given to you by your health care provider. Make sure you discuss any questions you have with your health care provider. ° ° ° ° °Please contact your physician with any problems or Same Day Surgery at [phone], Monday through Friday 6 am to 4 pm, or [place] [facility] [place] Main number at [phone]. °

## 2014-12-04 NOTE — Anesthesia Preprocedure Evaluation (Signed)
Anesthesia Evaluation  Patient identified by MRN, date of birth, ID band Patient awake    Reviewed: Allergy & Precautions, NPO status , Patient's Chart, lab work & pertinent test results, reviewed documented beta blocker date and time   History of Anesthesia Complications (+) PONV  Airway Mallampati: II  TM Distance: >3 FB     Dental  (+) Chipped   Pulmonary          Cardiovascular     Neuro/Psych    GI/Hepatic GERD-  Controlled,  Endo/Other  diabetesHypothyroidism   Renal/GU      Musculoskeletal  (+) Arthritis -,   Abdominal   Peds  Hematology   Anesthesia Other Findings   Reproductive/Obstetrics                             Anesthesia Physical Anesthesia Plan  ASA: III  Anesthesia Plan: MAC   Post-op Pain Management:    Induction:   Airway Management Planned: Nasal Cannula  Additional Equipment:   Intra-op Plan:   Post-operative Plan:   Informed Consent: I have reviewed the patients History and Physical, chart, labs and discussed the procedure including the risks, benefits and alternatives for the proposed anesthesia with the patient or authorized representative who has indicated his/her understanding and acceptance.     Plan Discussed with: CRNA  Anesthesia Plan Comments:         Anesthesia Quick Evaluation

## 2014-12-04 NOTE — Transfer of Care (Signed)
Immediate Anesthesia Transfer of Care Note  Patient: Brandy Patel  Procedure(s) Performed: Procedure(s) with comments: CATARACT EXTRACTION PHACO AND INTRAOCULAR LENS PLACEMENT (IOC) (Left) - [place]: 00:45.0 AP%; 23.5 CE: 10.59 Fluid lot # [id number]  Patient Location: PACU  Anesthesia Type:MAC  Level of Consciousness: awake, alert , oriented and patient cooperative  Airway & Oxygen Therapy: Patient Spontanous Breathing  Post-op Assessment: Report given to RN and Post -op Vital signs reviewed and stable  Post vital signs: Reviewed and stable  Last Vitals:  Filed Vitals:   12/04/14 1026  BP: 133/74  Pulse:   Temp: 36.2 C  Resp: 16    Complications: No apparent anesthesia complications

## 2015-07-10 ENCOUNTER — Encounter: Payer: Self-pay | Admitting: Ophthalmology

## 2016-03-13 ENCOUNTER — Other Ambulatory Visit: Payer: Self-pay | Admitting: Internal Medicine

## 2016-03-13 DIAGNOSIS — Z1231 Encounter for screening mammogram for malignant neoplasm of breast: Secondary | ICD-10-CM

## 2016-04-28 ENCOUNTER — Other Ambulatory Visit: Payer: Self-pay | Admitting: Internal Medicine

## 2016-04-28 ENCOUNTER — Ambulatory Visit
Admission: RE | Admit: 2016-04-28 | Discharge: 2016-04-28 | Disposition: A | Discharge status: Home / Self Care | Payer: Federal, State, Local not specified - PPO | Source: Ambulatory Visit | Attending: Internal Medicine | Admitting: Internal Medicine

## 2016-04-28 DIAGNOSIS — Z1231 Encounter for screening mammogram for malignant neoplasm of breast: Secondary | ICD-10-CM

## 2016-10-02 ENCOUNTER — Encounter: Payer: Self-pay | Admitting: *Deleted

## 2016-10-05 ENCOUNTER — Ambulatory Visit: Payer: Federal, State, Local not specified - PPO | Admitting: Anesthesiology

## 2016-10-05 ENCOUNTER — Encounter
Admission: RE | Disposition: A | Discharge status: Home / Self Care | Payer: Self-pay | Source: Ambulatory Visit | Attending: Gastroenterology

## 2016-10-05 ENCOUNTER — Ambulatory Visit
Admission: RE | Admit: 2016-10-05 | Discharge: 2016-10-05 | Disposition: A | Discharge status: Home / Self Care | Payer: Federal, State, Local not specified - PPO | Source: Ambulatory Visit | Attending: Gastroenterology | Admitting: Gastroenterology

## 2016-10-05 DIAGNOSIS — K648 Other hemorrhoids: Secondary | ICD-10-CM | POA: Insufficient documentation

## 2016-10-05 DIAGNOSIS — Z1211 Encounter for screening for malignant neoplasm of colon: Secondary | ICD-10-CM | POA: Insufficient documentation

## 2016-10-05 DIAGNOSIS — K219 Gastro-esophageal reflux disease without esophagitis: Secondary | ICD-10-CM | POA: Diagnosis not present

## 2016-10-05 DIAGNOSIS — D123 Benign neoplasm of transverse colon: Secondary | ICD-10-CM | POA: Diagnosis not present

## 2016-10-05 DIAGNOSIS — E039 Hypothyroidism, unspecified: Secondary | ICD-10-CM | POA: Insufficient documentation

## 2016-10-05 DIAGNOSIS — Z79899 Other long term (current) drug therapy: Secondary | ICD-10-CM | POA: Insufficient documentation

## 2016-10-05 DIAGNOSIS — K644 Residual hemorrhoidal skin tags: Secondary | ICD-10-CM | POA: Diagnosis not present

## 2016-10-05 DIAGNOSIS — Z86010 Personal history of colonic polyps: Secondary | ICD-10-CM | POA: Diagnosis not present

## 2016-10-05 DIAGNOSIS — K5730 Diverticulosis of large intestine without perforation or abscess without bleeding: Secondary | ICD-10-CM | POA: Insufficient documentation

## 2016-10-05 DIAGNOSIS — K635 Polyp of colon: Secondary | ICD-10-CM | POA: Diagnosis not present

## 2016-10-05 HISTORY — PX: COLONOSCOPY: SHX5424

## 2016-10-05 SURGERY — COLONOSCOPY
Anesthesia: General

## 2016-10-05 MED ORDER — SODIUM CHLORIDE 0.9 % IV SOLN
INTRAVENOUS | Status: DC
  Administered 2016-10-05: 16:00:00 via INTRAVENOUS

## 2016-10-05 MED ORDER — PROPOFOL 500 MG/50ML IV EMUL
INTRAVENOUS | Status: AC
  Filled 2016-10-05: qty 50

## 2016-10-05 MED ORDER — PROPOFOL 500 MG/50ML IV EMUL
INTRAVENOUS | Status: DC | PRN
  Administered 2016-10-05: 150 ug/kg/min via INTRAVENOUS

## 2016-10-05 MED ORDER — PROPOFOL 10 MG/ML IV BOLUS
INTRAVENOUS | Status: DC | PRN
  Administered 2016-10-05: 60 mg via INTRAVENOUS

## 2016-10-05 NOTE — Op Note (Signed)
[facility] Gastroenterology Patient Name: Brandy Patel Procedure Date: 10/05/2016 3:42 PM MRN: [id number] Account #: [id number] Date of Birth: [birth year] Admit Type: Outpatient Age: 71 Room: [facility] ENDO ROOM 3 Gender: Female Note Status: Finalized Procedure:            Colonoscopy Indications:          High risk colon cancer surveillance: Personal history                        of colonic polyps, Personal history of colonic polyps Providers:            Lollie Sails, MD Referring MD:         Ramonita Lab, MD (Referring MD) Medicines:            Monitored Anesthesia Care Complications:        No immediate complications. Procedure:            Pre-Anesthesia Assessment:                       - ASA Grade Assessment: III - A patient with severe                        systemic disease.                       After obtaining informed consent, the colonoscope was                        passed under direct vision. Throughout the procedure,                        the patient's blood pressure, pulse, and oxygen                        saturations were monitored continuously. The                        Colonoscope was introduced through the anus and                        advanced to the the cecum, identified by appendiceal                        orifice and ileocecal valve. The colonoscopy was                        performed with moderate difficulty due to significant                        looping and a tortuous colon. Successful completion of                        the procedure was aided by changing the patient to a                        supine position, changing the patient to a prone                        position and using manual pressure. The patient  tolerated the procedure well. The quality of the bowel                        preparation was good. Findings:      A 5 mm polyp was found in the transverse colon. The polyp was   pedunculated. The polyp was removed with a cold snare. Resection and       retrieval were complete.      A 2 mm polyp was found in the proximal ascending colon. The polyp was       sessile. The polyp was removed with a cold biopsy forceps. Resection and       retrieval were complete.      Multiple medium-mouthed diverticula were found in the sigmoid colon,       descending colon, transverse colon and ascending colon.      Non-bleeding internal hemorrhoids were found during retroflexion and       during anoscopy. The hemorrhoids were medium-sized and Grade I (internal       hemorrhoids that do not prolapse).      The perianal exam findings include skin tags. Impression:           - One 5 mm polyp in the transverse colon, removed with                        a cold snare. Resected and retrieved.                       - One 2 mm polyp in the proximal ascending colon,                        removed with a cold biopsy forceps. Resected and                        retrieved.                       - Diverticulosis in the sigmoid colon, in the                        descending colon, in the transverse colon and in the                        ascending colon.                       - Non-bleeding internal hemorrhoids.                       - Perianal skin tags found on perianal exam. Recommendation:       - Discharge patient to home.                       - Telephone GI clinic for pathology results in 1 week. Procedure Code(s):    --- Professional ---                       [phone], Colonoscopy, flexible; with removal of tumor(s),                        polyp(s), or other lesion(s) by snare technique  44818, 27, Colonoscopy, flexible; with biopsy, single                        or multiple Diagnosis Code(s):    --- Professional ---                       Z86.010, Personal history of colonic polyps                       D12.3, Benign neoplasm of transverse colon (hepatic                         flexure or splenic flexure)                       D12.2, Benign neoplasm of ascending colon                       K64.0, First degree hemorrhoids                       K64.4, Residual hemorrhoidal skin tags                       K57.30, Diverticulosis of large intestine without                        perforation or abscess without bleeding CPT copyright 2016 [employer]. All rights reserved. The codes documented in this report are preliminary and upon coder review may  be revised to meet current compliance requirements. Lollie Sails, MD 10/05/2016 4:26:13 PM This report has been signed electronically. Number of Addenda: 0 Note Initiated On: 10/05/2016 3:42 PM Scope Withdrawal Time: 0 hours 7 minutes 52 seconds  Total Procedure Duration: 0 hours 32 minutes 21 seconds       [facility]

## 2016-10-05 NOTE — Transfer of Care (Signed)
Immediate Anesthesia Transfer of Care Note  Patient: Brandy Patel  Procedure(s) Performed: Procedure(s): COLONOSCOPY (N/A)  Patient Location: PACU  Anesthesia Type:General  Level of Consciousness: sedated  Airway & Oxygen Therapy: Patient Spontanous Breathing and Patient connected to nasal cannula oxygen  Post-op Assessment: Report given to RN and Post -op Vital signs reviewed and stable  Post vital signs: Reviewed and stable  Last Vitals:  Vitals:   10/05/16 1511  BP: 135/86  Pulse: 80  Resp: 18  Temp: 36.6 C    Last Pain:  Vitals:   10/05/16 1511  TempSrc: Tympanic         Complications: No apparent anesthesia complications

## 2016-10-05 NOTE — Anesthesia Procedure Notes (Signed)
Date/Time: 10/05/2016 3:52 PM Performed by: Nelda Marseille Pre-anesthesia Checklist: Patient identified, Emergency Drugs available, Suction available, Patient being monitored and Timeout performed Oxygen Delivery Method: Nasal cannula

## 2016-10-05 NOTE — Anesthesia Post-op Follow-up Note (Cosign Needed)
Anesthesia QCDR form completed.        

## 2016-10-05 NOTE — Anesthesia Preprocedure Evaluation (Signed)
Anesthesia Evaluation  Patient identified by MRN, date of birth, ID band Patient awake    Reviewed: Allergy & Precautions, H&P , NPO status , Patient's Chart, lab work & pertinent test results  History of Anesthesia Complications (+) PONV and history of anesthetic complications  Airway Mallampati: II   Neck ROM: full    Dental   Pulmonary asthma ,           Cardiovascular negative cardio ROS       Neuro/Psych negative neurological ROS  negative psych ROS   GI/Hepatic Neg liver ROS, GERD  ,  Endo/Other  neg diabetesHypothyroidism   Renal/GU negative Renal ROS     Musculoskeletal   Abdominal   Peds  Hematology  (+) Blood dyscrasia (h/o ITP), ,   Anesthesia Other Findings Past Medical History: No date: Arthritis No date: Bronchitis No date: Complication of anesthesia     Comment: N&V No date: GERD (gastroesophageal reflux disease) No date: HOH (hard of hearing) No date: Hypothyroidism No date: Osteopenia No date: PONV (postoperative nausea and vomiting) No date: Seasonal allergies No date: Syncope No date: Uterine cancer (HCC) No date: Wheezing   Reproductive/Obstetrics negative OB ROS                             Anesthesia Physical  Anesthesia Plan  ASA: III  Anesthesia Plan: General   Post-op Pain Management:    Induction:   Airway Management Planned:   Additional Equipment:   Intra-op Plan:   Post-operative Plan:   Informed Consent: I have reviewed the patients History and Physical, chart, labs and discussed the procedure including the risks, benefits and alternatives for the proposed anesthesia with the patient or authorized representative who has indicated his/her understanding and acceptance.   Dental Advisory Given  Plan Discussed with: Anesthesiologist, CRNA and Surgeon  Anesthesia Plan Comments:         Anesthesia Quick Evaluation

## 2016-10-05 NOTE — H&P (Signed)
Outpatient short stay form Pre-procedure 10/05/2016 3:32 PM Lollie Sails MD  Primary Physician: Dr. Ramonita Lab  Reason for visit:  Colonoscopy  History of present illness:  Patient is a 71 year old female presenting today as above. She has personal history of adenomatous colon polyps. He tolerated her prep well. She takes no aspirin or blood thinning agents. 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 This has not recurred since then. I reviewed old laboratories at the past several years that show no problems with platelet counts.    Current Facility-Administered Medications:  .  0.9 %  sodium chloride infusion, , Intravenous, Continuous, Lollie Sails, MD .  0.9 %  sodium chloride infusion, , Intravenous, Continuous, Lollie Sails, MD, Last Rate: 20 mL/hr at 10/05/16 1532  Prescriptions Prior to Admission  Medication Sig Dispense Refill Last Dose  . calcium carbonate (CALCIUM 600) 600 MG TABS tablet Take 600 mg by mouth every morning.   Past Week at Unknown time  . cetirizine (ZYRTEC) 10 MG tablet Take 10 mg by mouth daily.   Past Month at Unknown time  . fluticasone (FLONASE) 50 MCG/ACT nasal spray Place 1 spray into both nostrils daily as needed for allergies or rhinitis.   Past Month at Unknown time  . ibuprofen (ADVIL,MOTRIN) 200 MG tablet Take 200 mg by mouth every 6 (six) hours as needed. Pt took 400 mg this am- 10/05/2016   10/05/2016 at 07:30  . levothyroxine (SYNTHROID) 25 MCG tablet Take 25 mcg by mouth daily before breakfast.   10/05/2016 at Unknown time  . trimethoprim (TRIMPEX) 100 MG tablet Take 100 mg by mouth daily.   10/03/2016 at 21:00  . glucosamine-chondroitin 500-400 MG tablet Take 1 tablet by mouth every morning.   10/03/2016  . Vitamin D, Cholecalciferol, 1000 UNITS TABS Take 2 capsules by mouth every morning.   10/03/2016     Allergies  Allergen Reactions  . Demerol [Meperidine] Rash  . Cat's Claw [Uncaria  Toment[place] (Cats Claw)]   . Levaquin [Levofloxacin]   . Lexapro [Escitalopram]   . Sulfa Antibiotics Other (See Comments)    N/V, diarrhea     Past Medical History:  Diagnosis Date  . Arthritis   . Bronchitis   . Complication of anesthesia    N&V  . GERD (gastroesophageal reflux disease)   . HOH (hard of hearing)   . Hypothyroidism   . Osteopenia   . PONV (postoperative nausea and vomiting)   . Seasonal allergies   . Syncope   . Uterine cancer ([place])   . Wheezing     Review of systems:      Physical Exam    Heart and lungs: Regular rate and rhythm without rub or gallop, lungs are bilaterally clear.    HEENT: Normocephalic atraumatic eyes are anicteric    Other:     Pertinant exam for procedure: Soft nontender nondistended bowel sounds positive normoactive.    Planned proceedures: Colonoscopy and indicated procedures. I have discussed the risks benefits and complications of procedures to include not limited to bleeding, infection, perforation and the risk of sedation and the patient wishes to proceed.    Lollie Sails, MD Gastroenterology 10/05/2016  3:32 PM

## 2016-10-07 LAB — SURGICAL PATHOLOGY

## 2016-10-08 ENCOUNTER — Encounter: Payer: Self-pay | Admitting: Gastroenterology

## 2016-10-08 NOTE — Anesthesia Postprocedure Evaluation (Signed)
Anesthesia Post Note  Patient: Brandy Patel  Procedure(s) Performed: Procedure(s) (LRB): COLONOSCOPY (N/A)  Patient location during evaluation: Endoscopy Anesthesia Type: General Level of consciousness: awake and alert Pain management: pain level controlled Vital Signs Assessment: post-procedure vital signs reviewed and stable Respiratory status: spontaneous breathing, nonlabored ventilation, respiratory function stable and patient connected to nasal cannula oxygen Cardiovascular status: blood pressure returned to baseline and stable Postop Assessment: no signs of nausea or vomiting Anesthetic complications: no     Last Vitals:  Vitals:   10/05/16 1646 10/05/16 1656  BP: 101/64 107/62  Pulse: 79 76  Resp: 16 10  Temp:      Last Pain:  Vitals:   10/06/16 0740  TempSrc:   PainSc: 0-No pain                 Martha Clan

## 2017-03-17 ENCOUNTER — Other Ambulatory Visit: Payer: Self-pay | Admitting: Internal Medicine

## 2017-03-17 DIAGNOSIS — Z1231 Encounter for screening mammogram for malignant neoplasm of breast: Secondary | ICD-10-CM

## 2017-04-30 ENCOUNTER — Ambulatory Visit
Admission: RE | Admit: 2017-04-30 | Discharge: 2017-04-30 | Disposition: A | Discharge status: Home / Self Care | Payer: Federal, State, Local not specified - PPO | Source: Ambulatory Visit | Attending: Internal Medicine | Admitting: Internal Medicine

## 2017-04-30 DIAGNOSIS — Z1231 Encounter for screening mammogram for malignant neoplasm of breast: Secondary | ICD-10-CM | POA: Diagnosis not present
